# Patient Record
Sex: Male | Born: 1975 | Hispanic: No | Marital: Married | Smoking: Former smoker
Health system: Southern US, Community
[De-identification: ages and names within clinical notes are randomized; demographics above are authoritative.]

## PROBLEM LIST (undated history)

## (undated) DIAGNOSIS — T7840XA Allergy, unspecified, initial encounter: Secondary | ICD-10-CM

## (undated) DIAGNOSIS — E785 Hyperlipidemia, unspecified: Secondary | ICD-10-CM

## (undated) HISTORY — DX: Hyperlipidemia, unspecified: E78.5

## (undated) HISTORY — DX: Allergy, unspecified, initial encounter: T78.40XA

---

## 2016-02-22 DIAGNOSIS — H5213 Myopia, bilateral: Secondary | ICD-10-CM | POA: Diagnosis not present

## 2016-02-22 DIAGNOSIS — H52223 Regular astigmatism, bilateral: Secondary | ICD-10-CM | POA: Diagnosis not present

## 2017-04-27 ENCOUNTER — Ambulatory Visit: Payer: Self-pay | Admitting: Physician Assistant

## 2017-06-01 DIAGNOSIS — Z7689 Persons encountering health services in other specified circumstances: Secondary | ICD-10-CM | POA: Diagnosis not present

## 2017-06-01 DIAGNOSIS — R002 Palpitations: Secondary | ICD-10-CM | POA: Diagnosis not present

## 2017-06-07 DIAGNOSIS — R002 Palpitations: Secondary | ICD-10-CM | POA: Diagnosis not present

## 2017-06-08 DIAGNOSIS — Z0000 Encounter for general adult medical examination without abnormal findings: Secondary | ICD-10-CM | POA: Diagnosis not present

## 2017-06-08 DIAGNOSIS — R002 Palpitations: Secondary | ICD-10-CM | POA: Diagnosis not present

## 2017-06-08 DIAGNOSIS — Z125 Encounter for screening for malignant neoplasm of prostate: Secondary | ICD-10-CM | POA: Diagnosis not present

## 2017-08-21 DIAGNOSIS — R0781 Pleurodynia: Secondary | ICD-10-CM | POA: Diagnosis not present

## 2018-02-04 DIAGNOSIS — H524 Presbyopia: Secondary | ICD-10-CM | POA: Diagnosis not present

## 2018-02-04 DIAGNOSIS — H5213 Myopia, bilateral: Secondary | ICD-10-CM | POA: Diagnosis not present

## 2018-02-04 DIAGNOSIS — H52223 Regular astigmatism, bilateral: Secondary | ICD-10-CM | POA: Diagnosis not present

## 2019-02-27 ENCOUNTER — Ambulatory Visit: Payer: Self-pay | Attending: Internal Medicine

## 2019-02-27 DIAGNOSIS — Z20828 Contact with and (suspected) exposure to other viral communicable diseases: Secondary | ICD-10-CM | POA: Insufficient documentation

## 2019-02-27 DIAGNOSIS — Z20822 Contact with and (suspected) exposure to covid-19: Secondary | ICD-10-CM

## 2019-02-28 LAB — NOVEL CORONAVIRUS, NAA: SARS-CoV-2, NAA: NOT DETECTED

## 2019-03-12 DIAGNOSIS — H5213 Myopia, bilateral: Secondary | ICD-10-CM | POA: Diagnosis not present

## 2019-03-12 DIAGNOSIS — H524 Presbyopia: Secondary | ICD-10-CM | POA: Diagnosis not present

## 2019-03-12 DIAGNOSIS — H52223 Regular astigmatism, bilateral: Secondary | ICD-10-CM | POA: Diagnosis not present

## 2019-04-15 ENCOUNTER — Ambulatory Visit: Payer: 59 | Attending: Internal Medicine

## 2019-04-15 DIAGNOSIS — Z20822 Contact with and (suspected) exposure to covid-19: Secondary | ICD-10-CM | POA: Insufficient documentation

## 2019-04-15 DIAGNOSIS — Z03818 Encounter for observation for suspected exposure to other biological agents ruled out: Secondary | ICD-10-CM | POA: Diagnosis not present

## 2019-04-15 DIAGNOSIS — Z20828 Contact with and (suspected) exposure to other viral communicable diseases: Secondary | ICD-10-CM | POA: Diagnosis not present

## 2019-04-16 LAB — NOVEL CORONAVIRUS, NAA: SARS-CoV-2, NAA: NOT DETECTED

## 2020-01-21 ENCOUNTER — Other Ambulatory Visit: Payer: Self-pay | Admitting: Internal Medicine

## 2020-01-21 DIAGNOSIS — R109 Unspecified abdominal pain: Secondary | ICD-10-CM | POA: Diagnosis not present

## 2020-01-21 DIAGNOSIS — R1032 Left lower quadrant pain: Secondary | ICD-10-CM | POA: Diagnosis not present

## 2020-01-21 DIAGNOSIS — Z13220 Encounter for screening for lipoid disorders: Secondary | ICD-10-CM | POA: Diagnosis not present

## 2020-01-21 DIAGNOSIS — Z125 Encounter for screening for malignant neoplasm of prostate: Secondary | ICD-10-CM | POA: Diagnosis not present

## 2020-01-27 ENCOUNTER — Ambulatory Visit
Admission: RE | Admit: 2020-01-27 | Discharge: 2020-01-27 | Disposition: A | Discharge status: Home / Self Care | Payer: 59 | Source: Ambulatory Visit | Attending: Internal Medicine | Admitting: Internal Medicine

## 2020-01-27 ENCOUNTER — Other Ambulatory Visit: Payer: Self-pay

## 2020-01-27 DIAGNOSIS — R1032 Left lower quadrant pain: Secondary | ICD-10-CM | POA: Diagnosis not present

## 2020-01-27 DIAGNOSIS — K760 Fatty (change of) liver, not elsewhere classified: Secondary | ICD-10-CM | POA: Diagnosis not present

## 2020-01-27 MED ORDER — IOHEXOL 300 MG/ML  SOLN
100.0000 mL | Freq: Once | INTRAMUSCULAR | Status: AC | PRN
  Administered 2020-01-27: 100 mL via INTRAVENOUS

## 2020-03-22 DIAGNOSIS — Z03818 Encounter for observation for suspected exposure to other biological agents ruled out: Secondary | ICD-10-CM | POA: Diagnosis not present

## 2020-03-22 DIAGNOSIS — Z20822 Contact with and (suspected) exposure to covid-19: Secondary | ICD-10-CM | POA: Diagnosis not present

## 2020-03-25 DIAGNOSIS — Z03818 Encounter for observation for suspected exposure to other biological agents ruled out: Secondary | ICD-10-CM | POA: Diagnosis not present

## 2020-03-25 DIAGNOSIS — Z20822 Contact with and (suspected) exposure to covid-19: Secondary | ICD-10-CM | POA: Diagnosis not present

## 2021-11-02 IMAGING — CT CT ABD-PELV W/ CM
2 of 5 series · 13 of 32 positions shown, 19 images · IV contrast (APPLIED)
Comparison: None.

CLINICAL DATA: Left lower quadrant pain

EXAM:
CT ABDOMEN AND PELVIS WITH CONTRAST
TECHNIQUE: Multidetector CT imaging of the abdomen and pelvis was performed
using the standard protocol following bolus administration of
intravenous contrast.
CONTRAST:  100mL OMNIPAQUE IOHEXOL 300 MG/ML  SOLN

[Series 2: axial st · axial · 0.80mm/px · z∈[-976,-550]mm · 11 of 103 slices shown, 17 images]
[im 9/103  soft-tissue]
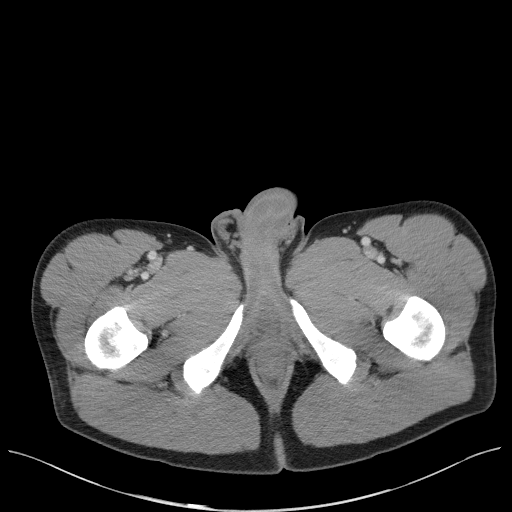
[im 9/103  bone]
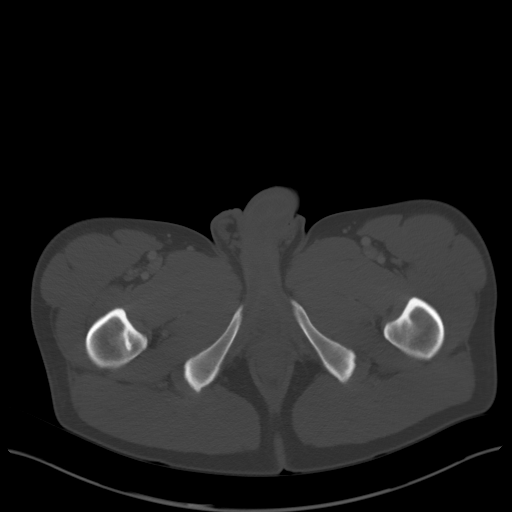
[im 18/103  soft-tissue]
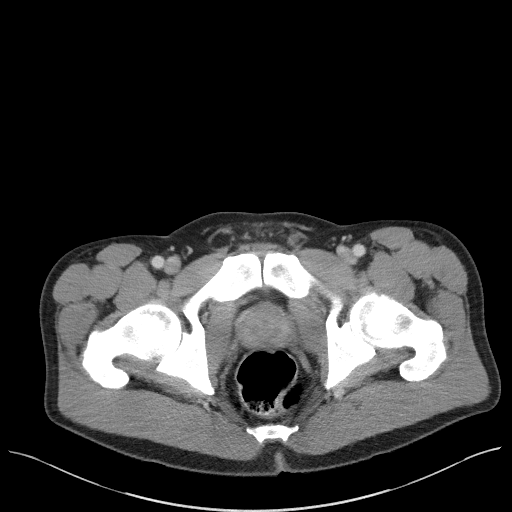
[im 26/103  soft-tissue]
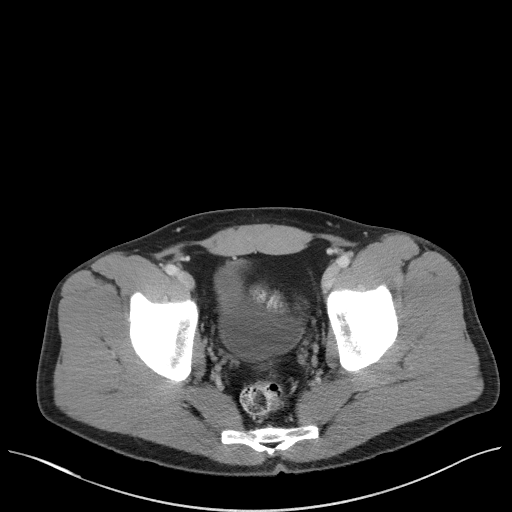
[im 35/103  soft-tissue]
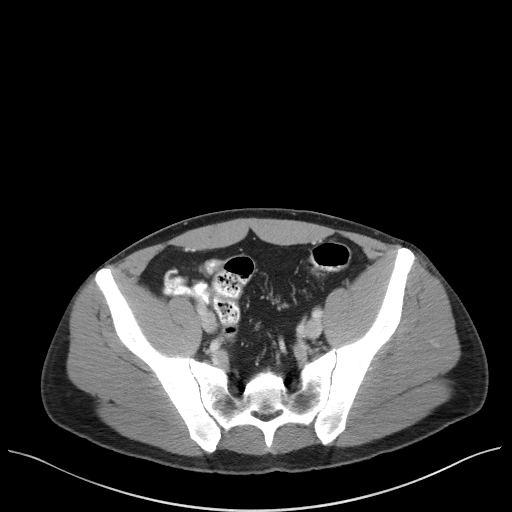
[im 43/103  soft-tissue]
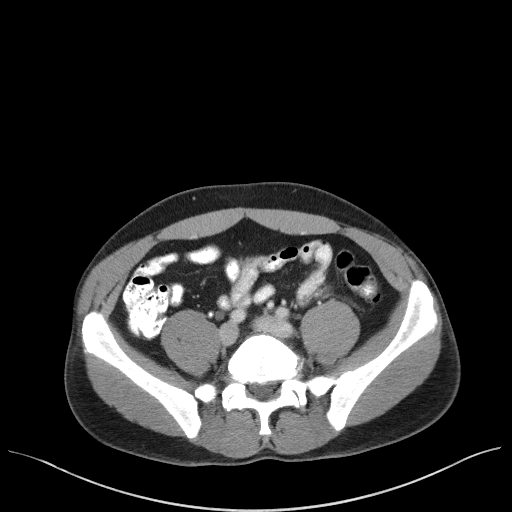
[im 52/103  soft-tissue]
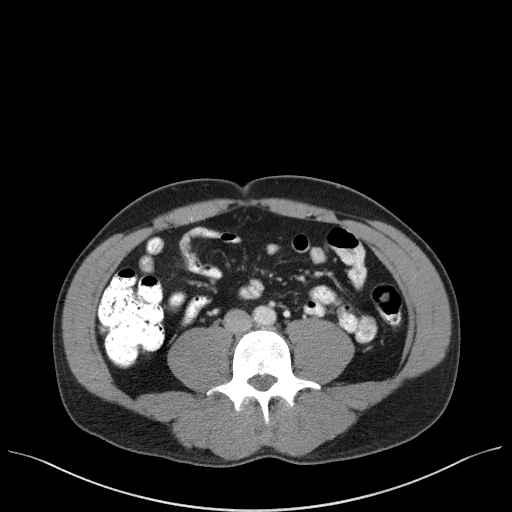
[im 60/103  soft-tissue]
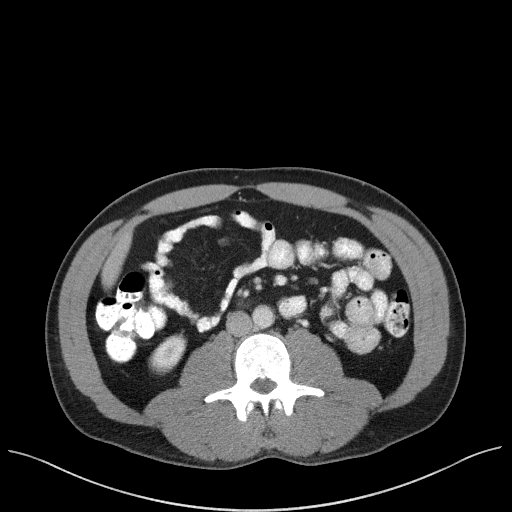
[im 69/103  soft-tissue]
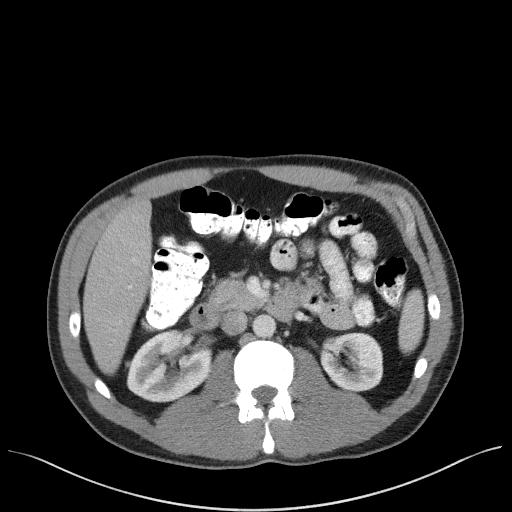
[im 69/103  lung]
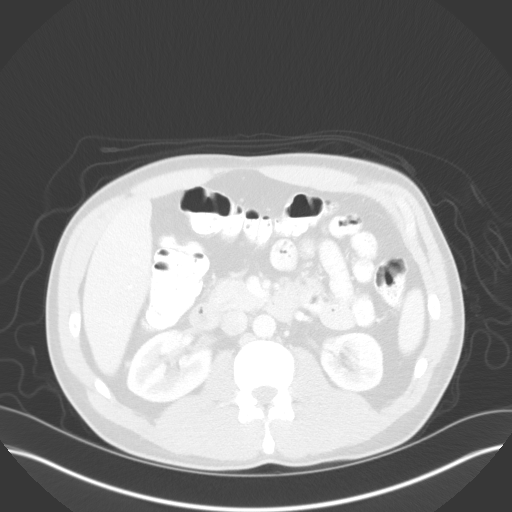
[im 77/103  soft-tissue]
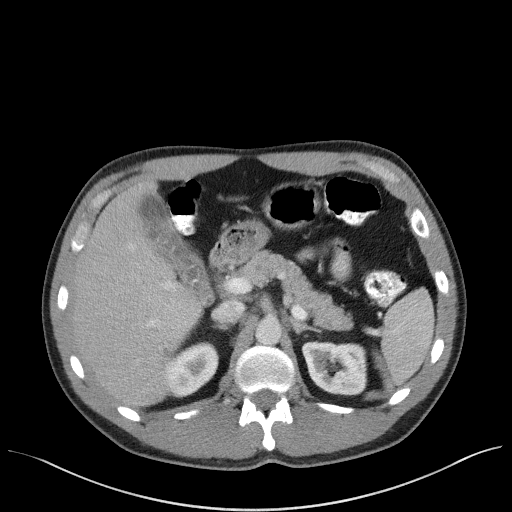
[im 77/103  lung]
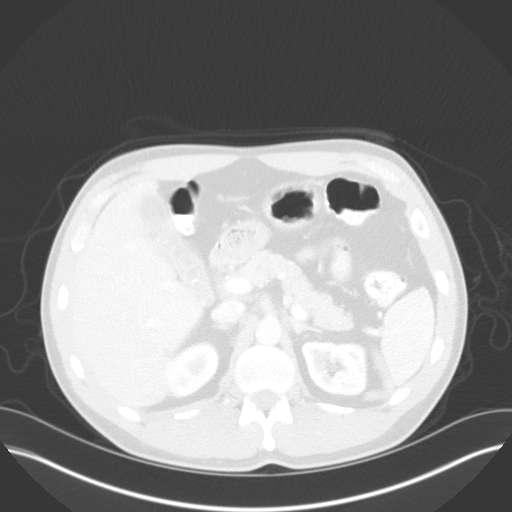
[im 77/103  bone]
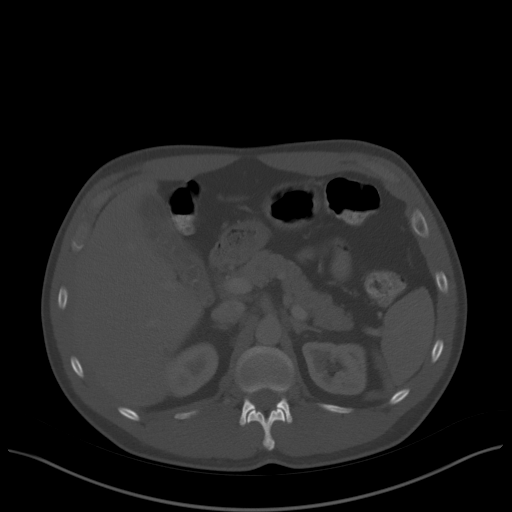
[im 86/103  soft-tissue]
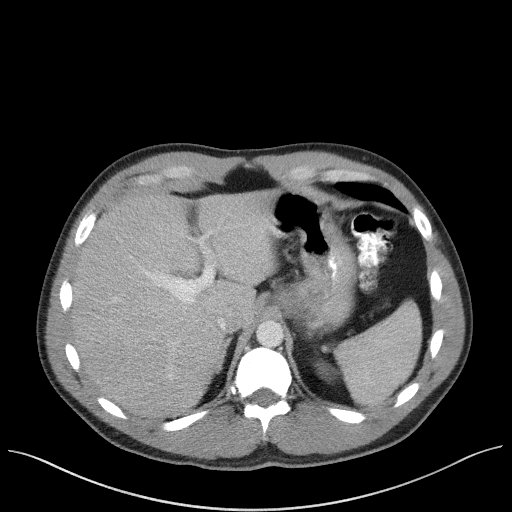
[im 86/103  lung]
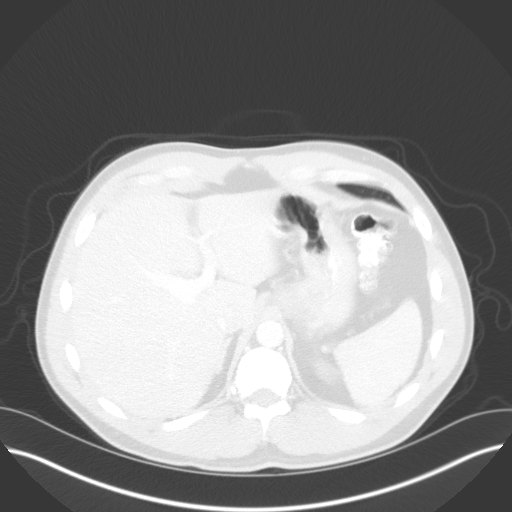
[im 94/103  soft-tissue]
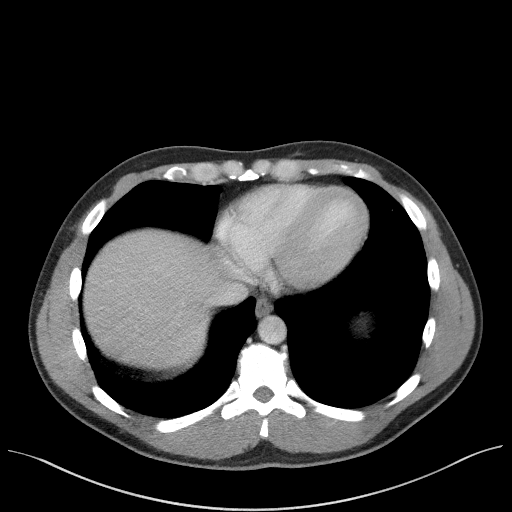
[im 94/103  lung]
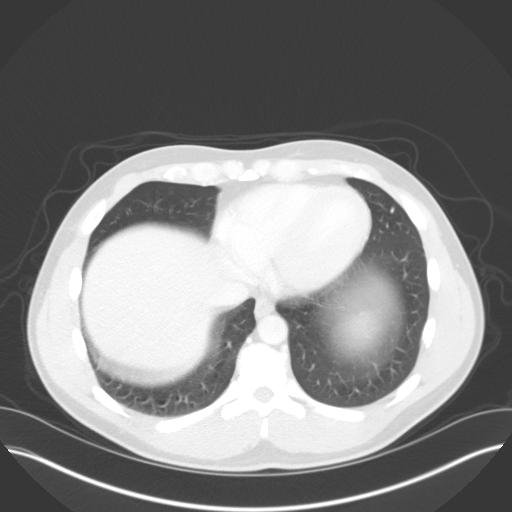

[Series 5: axial st repeat. · axial · 0.80mm/px · z∈[-650,-600]mm · 2 of 39 slices shown]
[im 10/39  soft-tissue]
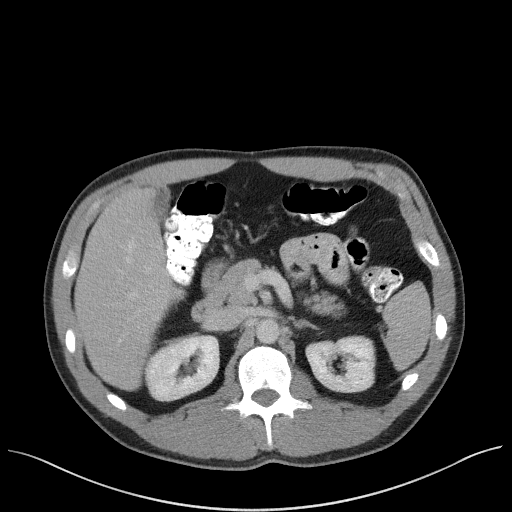
[im 20/39  soft-tissue]
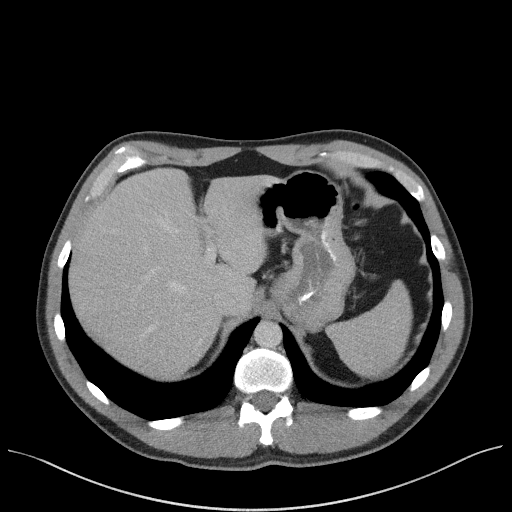

[13 of 32 positions shown; findings below may reference images not displayed]

FINDINGS: Lower chest: Lung bases are clear. No effusions. Heart is normal
size.

Hepatobiliary: Gallstones fill the gallbladder. No focal hepatic
abnormality. Suspect mild diffuse fatty infiltration of the liver.

Pancreas: No focal abnormality or ductal dilatation.

Spleen: No focal abnormality.  Normal size.

Adrenals/Urinary Tract: No adrenal abnormality. No focal renal
abnormality. No stones or hydronephrosis. Urinary bladder is
unremarkable.

Stomach/Bowel: Normal appendix. Stomach, large and small bowel
grossly unremarkable.

Vascular/Lymphatic: No evidence of aneurysm or adenopathy.

Reproductive: No visible focal abnormality.

Other: No free fluid or free air.

Musculoskeletal: No acute bony abnormality.
IMPRESSION: Cholelithiasis.

Suspect mild hepatic steatosis.

No acute findings in the abdomen or pelvis. No explanation for the
patient's left lower quadrant pain.

## 2022-03-28 DIAGNOSIS — R5383 Other fatigue: Secondary | ICD-10-CM | POA: Diagnosis not present

## 2022-03-28 DIAGNOSIS — Z1211 Encounter for screening for malignant neoplasm of colon: Secondary | ICD-10-CM | POA: Diagnosis not present

## 2022-03-28 DIAGNOSIS — Z2821 Immunization not carried out because of patient refusal: Secondary | ICD-10-CM | POA: Diagnosis not present

## 2022-03-28 DIAGNOSIS — Z1331 Encounter for screening for depression: Secondary | ICD-10-CM | POA: Diagnosis not present

## 2022-03-28 DIAGNOSIS — R5381 Other malaise: Secondary | ICD-10-CM | POA: Diagnosis not present

## 2022-03-28 DIAGNOSIS — Z0000 Encounter for general adult medical examination without abnormal findings: Secondary | ICD-10-CM | POA: Diagnosis not present

## 2022-05-10 ENCOUNTER — Ambulatory Visit (AMBULATORY_SURGERY_CENTER): Payer: Commercial Managed Care - PPO | Admitting: *Deleted

## 2022-05-10 ENCOUNTER — Encounter: Payer: Self-pay | Admitting: Internal Medicine

## 2022-05-10 VITALS — Ht 70.0 in | Wt 190.0 lb

## 2022-05-10 DIAGNOSIS — Z1211 Encounter for screening for malignant neoplasm of colon: Secondary | ICD-10-CM

## 2022-05-10 MED ORDER — NA SULFATE-K SULFATE-MG SULF 17.5-3.13-1.6 GM/177ML PO SOLN: 1.0000 | Freq: Once | ORAL | 0 refills | Status: AC

## 2022-05-10 NOTE — Progress Notes (Signed)
No egg or soy allergy known to patient  No issues known to pt with past sedation with any surgeries or procedures Patient denies ever being intubated No issues moving head or neck No issues with swallowing  No FH of Malignant Hyperthermia Pt is not on diet pills Pt is not on  home 02  Pt is not on blood thinners  Pt denies issues with constipation  Pt is not on dialysis Pt denies any upcoming cardiac testing Pt encouraged to use to use Singlecare or Goodrx to reduce cost  Patient's chart reviewed by Osvaldo Angst CNRA prior to previsit and patient appropriate for the [place].  Previsit completed and red dot placed by patient's name on their procedure day (on provider's schedule).  .  Instructions reviewed with pt and pt states understanding. Instructed to review again prior to procedure. Pt states they will.

## 2022-05-29 ENCOUNTER — Other Ambulatory Visit: Payer: Self-pay

## 2022-05-29 MED ORDER — NA SULFATE-K SULFATE-MG SULF 17.5-3.13-1.6 GM/177ML PO SOLN
354.0000 mL | Freq: Once | ORAL | 0 refills | Status: AC
  Filled 2022-05-29: qty 354, 1d supply, fill #0

## 2022-05-29 MED ORDER — NA SULFATE-K SULFATE-MG SULF 17.5-3.13-1.6 GM/177ML PO SOLN
354.0000 mL | Freq: Once | ORAL | 0 refills | Status: DC
  Filled 2022-05-29: qty 354, 1d supply, fill #0

## 2022-05-29 MED ORDER — ATORVASTATIN CALCIUM 10 MG PO TABS
10.0000 mg | ORAL_TABLET | Freq: Every day | ORAL | 1 refills | Status: AC
  Filled 2022-05-29: qty 90, 90d supply, fill #0

## 2022-05-29 MED ORDER — ATORVASTATIN CALCIUM 10 MG PO TABS
10.0000 mg | ORAL_TABLET | Freq: Every day | ORAL | 1 refills | Status: DC
  Filled 2022-05-29: qty 90, 90d supply, fill #0

## 2022-06-01 ENCOUNTER — Other Ambulatory Visit: Payer: Self-pay

## 2022-06-08 ENCOUNTER — Ambulatory Visit (AMBULATORY_SURGERY_CENTER): Payer: Commercial Managed Care - PPO | Admitting: Internal Medicine

## 2022-06-08 ENCOUNTER — Encounter: Payer: Self-pay | Admitting: Internal Medicine

## 2022-06-08 VITALS — BP 99/47 | HR 80 | Temp 96.8°F | Resp 12 | Ht 70.0 in | Wt 190.0 lb

## 2022-06-08 DIAGNOSIS — D123 Benign neoplasm of transverse colon: Secondary | ICD-10-CM

## 2022-06-08 DIAGNOSIS — Z1211 Encounter for screening for malignant neoplasm of colon: Secondary | ICD-10-CM

## 2022-06-08 DIAGNOSIS — E785 Hyperlipidemia, unspecified: Secondary | ICD-10-CM | POA: Diagnosis not present

## 2022-06-08 MED ORDER — SODIUM CHLORIDE 0.9 % IV SOLN: 500.0000 mL | INTRAVENOUS | Status: DC

## 2022-06-08 NOTE — Progress Notes (Signed)
Called to room to assist during endoscopic procedure.  Patient ID and intended procedure confirmed with present staff. Received instructions for my participation in the procedure from the performing physician.  

## 2022-06-08 NOTE — Progress Notes (Signed)
Pt resting comfortably. VSS. Airway intact. SBAR complete to RN. All questions answered.   

## 2022-06-08 NOTE — Progress Notes (Signed)
Pt. states no medical or surgical changes since previsit or office visit. 

## 2022-06-08 NOTE — Patient Instructions (Addendum)
Recommendation:- Repeat colonoscopy in 7-10 years for surveillance.                           - Patient has a contact number available for                            emergencies. The signs and symptoms of potential                            delayed complications were discussed with the                            patient. Return to normal activities tomorrow.                            Written discharge instructions were provided to the                            patient.                           - Resume previous diet.                           - Continue present medications.                           - Await pathology results.  Handout on polyps given.  YOU HAD AN ENDOSCOPIC PROCEDURE TODAY AT THE [facility]:   Refer to the procedure report that was given to you for any specific questions about what was found during the examination.  If the procedure report does not answer your questions, please call your gastroenterologist to clarify.  If you requested that your care partner not be given the details of your procedure findings, then the procedure report has been included in a sealed envelope for you to review at your convenience later.  YOU SHOULD EXPECT: Some feelings of bloating in the abdomen. Passage of more gas than usual.  Walking can help get rid of the air that was put into your GI tract during the procedure and reduce the bloating. If you had a lower endoscopy (such as a colonoscopy or flexible sigmoidoscopy) you may notice spotting of blood in your stool or on the toilet paper. If you underwent a bowel prep for your procedure, you may not have a normal bowel movement for a few days.  Please Note:  You might notice some irritation and congestion in your nose or some drainage.  This is from the oxygen used during your procedure.  There is no need for concern and it should clear up in a day or so.  SYMPTOMS TO REPORT IMMEDIATELY:  Following lower endoscopy (colonoscopy or  flexible sigmoidoscopy):  Excessive amounts of blood in the stool  Significant tenderness or worsening of abdominal pains  Swelling of the abdomen that is new, acute  Fever of 100F or higher  For urgent or emergent issues, a gastroenterologist can be reached at any hour by calling [phone]. Do not use MyChart messaging for urgent concerns.   DIET:  We do recommend a small meal at   first, but then you may proceed to your regular diet.  Drink plenty of fluids but you should avoid alcoholic beverages for 24 hours.  ACTIVITY:  You should plan to take it easy for the rest of today and you should NOT DRIVE or use heavy machinery until tomorrow (because of the sedation medicines used during the test).    FOLLOW UP: Our staff will call the number listed on your records the next business day following your procedure.  We will call around 7:15- 8:00 am to check on you and address any questions or concerns that you may have regarding the information given to you following your procedure. If we do not reach you, we will leave a message.     If any biopsies were taken you will be contacted by phone or by letter within the next 1-3 weeks.  Please call us at [phone] if you have not heard about the biopsies in 3 weeks.   SIGNATURES/CONFIDENTIALITY: You and/or your care partner have signed paperwork which will be entered into your electronic medical record.  These signatures attest to the fact that that the information above on your After Visit Summary has been reviewed and is understood.  Full responsibility of the confidentiality of this discharge information lies with you and/or your care-partner. 

## 2022-06-08 NOTE — Progress Notes (Signed)
HISTORY OF PRESENT ILLNESS:  Anthony Blevins is a 47 y.o. male who is sent for routine screening colonoscopy.  Average risk.  Index exam.  No complaints  REVIEW OF SYSTEMS:  All non-GI ROS negative.  Past Medical History:  Diagnosis Date   Allergy    Hyperlipidemia     History reviewed. No pertinent surgical history.  Social History Kvon Trundy  reports that he has quit smoking. His smoking use included cigarettes. He has never used smokeless tobacco. He reports current alcohol use. He reports that he does not use drugs.  family history includes Stomach cancer in his paternal uncle.  Allergies  Allergen Reactions   Penicillin G Other (See Comments)       PHYSICAL EXAMINATION: Vital signs: BP (!) 99/47   Pulse 80   Temp (!) 96.8 F (36 C) (Skin)   Resp 12   Ht 5\' 10"  (1.778 m)   Wt 190 lb (86.2 kg)   SpO2 95%   BMI 27.26 kg/m  General: Well-developed, well-nourished, no acute distress HEENT: Sclerae are anicteric, conjunctiva pink. Oral mucosa intact Lungs: Clear Heart: Regular Abdomen: soft, nontender, nondistended, no obvious ascites, no peritoneal signs, normal bowel sounds. No organomegaly. Extremities: No edema Psychiatric: alert and oriented x3. Cooperative     ASSESSMENT:  Colon cancer screening   PLAN:   Screening colonoscopy

## 2022-06-08 NOTE — Op Note (Signed)
[facility] Patient Name: Anthony Blevins Procedure Date: 06/08/2022 7:53 AM MRN: [id number] Endoscopist: Wilhemina Bonito. Marina Goodell , MD, [id number] Age: 47 Referring MD:  Date of Birth: 25-Oct-1975 Gender: Male Account #: [id number] Procedure:                Colonoscopy with cold snare polypectomy x 1 Indications:              Screening for colorectal malignant neoplasm Medicines:                Monitored Anesthesia Care Procedure:                Pre-Anesthesia Assessment:                           - Prior to the procedure, a History and Physical                            was performed, and patient medications and                            allergies were reviewed. The patient's tolerance of                            previous anesthesia was also reviewed. The risks                            and benefits of the procedure and the sedation                            options and risks were discussed with the patient.                            All questions were answered, and informed consent                            was obtained. Prior Anticoagulants: The patient has                            taken no anticoagulant or antiplatelet agents. ASA                            Grade Assessment: II - A patient with mild systemic                            disease. After reviewing the risks and benefits,                            the patient was deemed in satisfactory condition to                            undergo the procedure.                           After obtaining informed consent, the colonoscope  was passed under direct vision. Throughout the                            procedure, the patient's blood pressure, pulse, and                            oxygen saturations were monitored continuously. The                            CF [id number] #[id number] was introduced through the anus                            and advanced to the the cecum, identified by                             appendiceal orifice and ileocecal valve. The                            ileocecal valve, appendiceal orifice, and rectum                            were photographed. The quality of the bowel                            preparation was excellent. The colonoscopy was                            performed without difficulty. The patient tolerated                            the procedure well. The bowel preparation used was                            SUPREP via split dose instruction. Scope In: 8:32:47 AM Scope Out: 8:47:48 AM Scope Withdrawal Time: 0 hours 12 minutes 40 seconds  Total Procedure Duration: 0 hours 15 minutes 1 second  Findings:                 A 3 mm polyp was found in the transverse colon. The                            polyp was removed with a cold snare. Resection and                            retrieval were complete.                           The exam was otherwise without abnormality on                            direct and retroflexion views. Complications:            No immediate complications. Estimated blood loss:  None. Estimated Blood Loss:     Estimated blood loss: none. Impression:               - One 3 mm polyp in the transverse colon, removed                            with a cold snare. Resected and retrieved.                           - The examination was otherwise normal on direct                            and retroflexion views. Recommendation:           - Repeat colonoscopy in 7-10 years for surveillance.                           - Patient has a contact number available for                            emergencies. The signs and symptoms of potential                            delayed complications were discussed with the                            patient. Return to normal activities tomorrow.                            Written discharge instructions were provided to the                            patient.                            - Resume previous diet.                           - Continue present medications.                           - Await pathology results. Wilhemina Bonito. Marina Goodell, MD 06/08/2022 8:57:09 AM This report has been signed electronically.

## 2022-06-09 ENCOUNTER — Telehealth: Payer: Self-pay | Admitting: *Deleted

## 2022-06-09 NOTE — Telephone Encounter (Signed)
  Follow up Call-     06/08/2022    7:21 AM  Call back number  Post procedure Call Back phone  # [phone]  Permission to leave phone message Yes     Patient questions:  Do you have a fever, pain , or abdominal swelling? No. Pain Score  0 *  Have you tolerated food without any problems? Yes.    Have you been able to return to your normal activities? Yes.    Do you have any questions about your discharge instructions: Diet   No. Medications  No. Follow up visit  No.  Do you have questions or concerns about your Care? No.  Actions: * If pain score is 4 or above: No action needed, pain <4.

## 2022-06-15 ENCOUNTER — Encounter: Payer: Self-pay | Admitting: Internal Medicine

## 2022-10-04 ENCOUNTER — Other Ambulatory Visit: Payer: Self-pay | Admitting: Physician Assistant

## 2022-10-04 ENCOUNTER — Ambulatory Visit
Admission: RE | Admit: 2022-10-04 | Discharge: 2022-10-04 | Disposition: A | Discharge status: Home / Self Care | Payer: Commercial Managed Care - PPO | Source: Ambulatory Visit | Attending: Physician Assistant | Admitting: Physician Assistant

## 2022-10-04 ENCOUNTER — Other Ambulatory Visit: Payer: Self-pay

## 2022-10-04 DIAGNOSIS — K828 Other specified diseases of gallbladder: Secondary | ICD-10-CM | POA: Diagnosis not present

## 2022-10-04 DIAGNOSIS — R1011 Right upper quadrant pain: Secondary | ICD-10-CM

## 2022-10-04 DIAGNOSIS — R197 Diarrhea, unspecified: Secondary | ICD-10-CM | POA: Diagnosis not present

## 2022-10-04 DIAGNOSIS — K8020 Calculus of gallbladder without cholecystitis without obstruction: Secondary | ICD-10-CM | POA: Diagnosis not present

## 2022-10-04 MED ORDER — ONDANSETRON 4 MG PO TBDP
4.0000 mg | ORAL_TABLET | Freq: Three times a day (TID) | ORAL | 0 refills | Status: AC | PRN
  Filled 2022-10-04: qty 20, 7d supply, fill #0

## 2022-10-05 ENCOUNTER — Other Ambulatory Visit: Payer: Commercial Managed Care - PPO

## 2022-10-09 ENCOUNTER — Ambulatory Visit: Payer: Commercial Managed Care - PPO | Admitting: Surgery

## 2022-10-09 ENCOUNTER — Encounter: Payer: Self-pay | Admitting: Surgery

## 2022-10-09 VITALS — BP 130/74 | HR 61 | Temp 98.2°F | Ht 70.5 in | Wt 179.0 lb

## 2022-10-09 DIAGNOSIS — K8020 Calculus of gallbladder without cholecystitis without obstruction: Secondary | ICD-10-CM

## 2022-10-09 NOTE — Progress Notes (Signed)
10/09/2022  Reason for Visit:  Cholelithiasis  Requesting Provider:  Maurine Minister, PA-C  History of Present Illness: Anthony Blevins is a 47 y.o. male presenting for evaluation of cholelithiasis.  The patient has a known history of cholelithiasis as seen previously on a CT scan of abdomen/pelvis on 01/27/20 which was done for left lower quadrant pain.  He reports that last weekend on 7/28, he started having pain in the periumbilical area, which he describes as a burning sensation.  This was associated with nausea but no emesis, and with profuse diarrhea.  He denies any RUQ or epigastric pain, or any radiation to his back.  He does report having gone out the night before and drinking a lot.  On 10/04/22 he went to his PCP's office and had labs and U/S done.  His U/S of RUQ showed cholelithiasis, with gall thickness of 3 mm without any pericholecystic fluid and with negative Murphy's sign.  His labs showed a WBC of 10.6, total bili of 1.8, AST 34, ALT 40, and Cr 1.5.  he does report feeling dehydrated with dark urine.  This has since normalized.  Currently he denies any pain, nausea, or emesis.  Past Medical History: Past Medical History:  Diagnosis Date   Allergy    Hyperlipidemia      Past Surgical History: History reviewed. No pertinent surgical history.  Home Medications: Prior to Admission medications   Medication Sig Start Date End Date Taking? Authorizing Provider  atorvastatin (LIPITOR) 10 MG tablet Take 1 tablet (10 mg total) by mouth daily. 04/01/22  Yes   Multiple Vitamin (MULTIVITAMIN WITH MINERALS) TABS tablet Take 1 tablet by mouth daily.   Yes [provider]  ondansetron (ZOFRAN-ODT) 4 MG disintegrating tablet Take 1 tablet (4 mg total) by mouth every 8 (eight) hours as needed. 10/04/22  Yes     Allergies: Allergies  Allergen Reactions   Penicillin G Other (See Comments)    Social History:  reports that he has quit smoking. His smoking use included  cigarettes. He has never used smokeless tobacco. He reports current alcohol use. He reports that he does not use drugs.   Family History: Family History  Problem Relation Age of Onset   Stomach cancer Paternal Uncle    Colon cancer Neg Hx    Colon polyps Neg Hx    Esophageal cancer Neg Hx    Rectal cancer Neg Hx     Review of Systems: Review of Systems  Constitutional:  Negative for chills and fever.  Respiratory:  Negative for shortness of breath.   Cardiovascular:  Negative for chest pain.  Gastrointestinal:  Positive for abdominal pain, diarrhea and nausea. Negative for vomiting.  Genitourinary:  Negative for dysuria.    Physical Exam BP 130/74   Pulse 61   Temp 98.2 F (36.8 C) (Oral)   Ht 5' 10.5" (1.791 m)   Wt 179 lb (81.2 kg)   SpO2 98%   BMI 25.32 kg/m  CONSTITUTIONAL: No acute distress, well nourished. HEENT:  Normocephalic, atraumatic, extraocular motion intact.  RESPIRATORY:  Normal respiratory effort without pathologic use of accessory muscles. CARDIOVASCULAR: Regular rhythm and rate GI: The abdomen is soft, non-distended, non-tender to palpation.  No umbilical hernia.  Negative Murphy's sign.  MUSCULOSKELETAL:  Normal muscle strength and tone in all four extremities.  No peripheral edema or cyanosis. NEUROLOGIC:  Motor and sensation is grossly normal.  Cranial nerves are grossly intact. PSYCH:  Alert and oriented to person, place and time. Affect is  normal.  Laboratory Analysis: Labs from 10/04/22: Na 138, K 4.2, Cl 102, CO2 26.6, BUN 17, Cr 1.5.  Total bili 1.8, direct bili 0.28, AST 34, ALT 40, Alk Phos 71, albumin 5.0.  WBC 10.6, Hgb 17.1, Hct 48.3, Plt 259.  Imaging: Ultrasound RUQ on 10/04/22: IMPRESSION: 1. Gallstones. 2. Gallbladder wall thickening is upper limits of normal at 3 mm. Negative for pericholecystic fluid or sonographic Murphy's sign. If there is a high clinical concern for acute cholecystitis consider further evaluation with nuclear  medicine hepatic biliary scan.  CT abdomen/pelvis on 01/27/20: IMPRESSION: Cholelithiasis. Suspect mild hepatic steatosis. No acute findings in the abdomen or pelvis. No explanation for the patient's left lower quadrant pain  Assessment and Plan: This is a 47 y.o. male with cholelithiasis.  --Discussed with the patient that although his CT scan from 2021 and ultrasound last week show cholelithiasis, his symptoms last week are more atypical given that his pain was more periumbilical with burning sensation, profuse diarrhea.  Discussed with him the more typical symptoms from biliary colic including RUQ or epigastric pain, sometimes radiation to the back, sometimes associated with nausea/vomiting.  Discussed with him how these symptoms start with his gallstones transiently obstructing the cystic duct.  Discussed how the gallbladder is more triggered by fatty/greasy foods.  The symptoms that he had could be more related to gastroenteritis vs alcohol related.  It is unclear which is the exact etiology, and although there is the possibility of being biliary related, cannot guarantee him that after cholecystectomy, these symptoms wont occur again.   --As such, for now would not recommend proceeding with cholecystectomy.  I think for now we can start watchful monitoring of symptoms, dietary modifications, and he will follow up with me in about a month to reassess. --He and his wife have a trip planned out of country in two weeks.  I think for now it's ok to proceed with that trip since it's unclear that this was gallbladder in etiology.    I spent 30 minutes dedicated to the care of this patient on the date of this encounter to include pre-visit review of records, face-to-face time with the patient discussing diagnosis and management, and any post-visit coordination of care.   Howie Ill, MD [facility]

## 2022-10-09 NOTE — Patient Instructions (Signed)
If you have any concerns or questions, please feel free to call our office. See follow up appointment.   Gallbladder Eating Plan High blood cholesterol, obesity, a sedentary lifestyle, an unhealthy diet, and diabetes are risk factors for developing gallstones. If you have a gallbladder condition, you may have trouble digesting fats and tolerating high fat intake. Eating a low-fat diet can help reduce your symptoms and may be helpful before and after having surgery to remove your gallbladder (cholecystectomy). Your health care provider may recommend that you work with a dietitian to help you reduce the amount of fat in your diet. What are tips for following this plan? General guidelines Limit your fat intake to less than 30% of your total daily calories. If you eat around 1,800 calories each day, this means eating less than 60 grams (g) of fat per day. Fat is an important part of a healthy diet. Eating a low-fat diet can make it hard to maintain a healthy body weight. Ask your dietitian how much fat, calories, and other nutrients you need each day. Eat small, frequent meals throughout the day instead of three large meals. Drink at least 8-10 cups (1.9-2.4 L) of fluid a day. Drink enough fluid to keep your urine pale yellow. If you drink alcohol: Limit how much you have to: 0-1 drink a day for women who are not pregnant. 0-2 drinks a day for men. Know how much alcohol is in a drink. In the [place] one drink equals one 12 oz bottle of beer (355 mL), one 5 oz glass of wine (148 mL), or one 1 oz glass of hard liquor (44 mL). Reading food labels  Check nutrition facts on food labels for the amount of fat per serving. Choose foods with less than 3 grams of fat per serving. Shopping Choose nonfat and low-fat healthy foods. Look for the words "nonfat," "low-fat," or "fat-free." Avoid buying processed or prepackaged foods. Cooking Cook using low-fat methods, such as baking, broiling, grilling, or  boiling. Cook with small amounts of healthy fats, such as olive oil, grapeseed oil, canola oil, avocado oil, or sunflower oil. What foods are recommended?  All fresh, frozen, or canned fruits and vegetables. Whole grains. Low-fat or nonfat (skim) milk and yogurt. Lean meat, skinless poultry, fish, eggs, and beans. Low-fat protein supplement powders or drinks. Spices and herbs. The items listed above may not be a complete list of foods and beverages you can eat and drink. Contact a dietitian for more information. What foods are not recommended? High-fat foods. These include baked goods, fast food, fatty cuts of meat, ice cream, french toast, sweet rolls, pizza, cheese bread, foods covered with butter, creamy sauces, or cheese. Fried foods. These include french fries, tempura, battered fish, breaded chicken, fried breads, and sweets. Foods that cause bloating and gas. The items listed above may not be a complete list of foods that you should avoid. Contact a dietitian for more information. Summary A low-fat diet can be helpful if you have a gallbladder condition, or before and after gallbladder surgery. Limit your fat intake to less than 30% of your total daily calories. This is about 60 g of fat if you eat 1,800 calories each day. Eat small, frequent meals throughout the day instead of three large meals. This information is not intended to replace advice given to you by your health care provider. Make sure you discuss any questions you have with your health care provider. Document Revised: 02/04/2021 Document Reviewed: 02/04/2021 Elsevier Patient Education  2024 [employer]

## 2022-10-12 DIAGNOSIS — H524 Presbyopia: Secondary | ICD-10-CM | POA: Diagnosis not present

## 2022-11-13 ENCOUNTER — Ambulatory Visit: Payer: Commercial Managed Care - PPO | Admitting: Surgery

## 2022-12-21 DIAGNOSIS — R079 Chest pain, unspecified: Secondary | ICD-10-CM | POA: Diagnosis not present

## 2022-12-21 DIAGNOSIS — Z880 Allergy status to penicillin: Secondary | ICD-10-CM | POA: Diagnosis not present

## 2022-12-21 DIAGNOSIS — I010 Acute rheumatic pericarditis: Secondary | ICD-10-CM | POA: Diagnosis not present

## 2022-12-21 DIAGNOSIS — I2699 Other pulmonary embolism without acute cor pulmonale: Secondary | ICD-10-CM | POA: Diagnosis not present

## 2022-12-21 DIAGNOSIS — R0789 Other chest pain: Secondary | ICD-10-CM | POA: Diagnosis not present

## 2022-12-22 DIAGNOSIS — R079 Chest pain, unspecified: Secondary | ICD-10-CM | POA: Diagnosis not present

## 2022-12-28 DIAGNOSIS — R0789 Other chest pain: Secondary | ICD-10-CM | POA: Diagnosis not present

## 2022-12-28 DIAGNOSIS — R7989 Other specified abnormal findings of blood chemistry: Secondary | ICD-10-CM | POA: Diagnosis not present

## 2022-12-28 DIAGNOSIS — Z125 Encounter for screening for malignant neoplasm of prostate: Secondary | ICD-10-CM | POA: Diagnosis not present

## 2022-12-28 DIAGNOSIS — Z2821 Immunization not carried out because of patient refusal: Secondary | ICD-10-CM | POA: Diagnosis not present

## 2022-12-28 DIAGNOSIS — N289 Disorder of kidney and ureter, unspecified: Secondary | ICD-10-CM | POA: Diagnosis not present

## 2023-01-01 ENCOUNTER — Other Ambulatory Visit: Payer: Self-pay | Admitting: Internal Medicine

## 2023-01-01 DIAGNOSIS — R0789 Other chest pain: Secondary | ICD-10-CM

## 2023-01-01 DIAGNOSIS — N289 Disorder of kidney and ureter, unspecified: Secondary | ICD-10-CM

## 2023-01-12 ENCOUNTER — Ambulatory Visit
Admission: RE | Admit: 2023-01-12 | Discharge: 2023-01-12 | Disposition: A | Discharge status: Home / Self Care | Payer: Commercial Managed Care - PPO | Source: Ambulatory Visit | Attending: Internal Medicine | Admitting: Internal Medicine

## 2023-01-12 DIAGNOSIS — R0789 Other chest pain: Secondary | ICD-10-CM | POA: Insufficient documentation

## 2023-01-12 DIAGNOSIS — N289 Disorder of kidney and ureter, unspecified: Secondary | ICD-10-CM | POA: Insufficient documentation

## 2023-06-29 DIAGNOSIS — Z0000 Encounter for general adult medical examination without abnormal findings: Secondary | ICD-10-CM | POA: Diagnosis not present

## 2023-06-29 DIAGNOSIS — M79671 Pain in right foot: Secondary | ICD-10-CM | POA: Diagnosis not present

## 2023-06-29 DIAGNOSIS — R0789 Other chest pain: Secondary | ICD-10-CM | POA: Diagnosis not present

## 2023-06-29 DIAGNOSIS — E782 Mixed hyperlipidemia: Secondary | ICD-10-CM | POA: Diagnosis not present

## 2023-06-29 DIAGNOSIS — Z1331 Encounter for screening for depression: Secondary | ICD-10-CM | POA: Diagnosis not present

## 2023-06-29 DIAGNOSIS — R768 Other specified abnormal immunological findings in serum: Secondary | ICD-10-CM | POA: Diagnosis not present

## 2023-06-29 DIAGNOSIS — M79672 Pain in left foot: Secondary | ICD-10-CM | POA: Diagnosis not present

## 2023-07-16 DIAGNOSIS — M2550 Pain in unspecified joint: Secondary | ICD-10-CM | POA: Diagnosis not present

## 2023-07-16 DIAGNOSIS — R61 Generalized hyperhidrosis: Secondary | ICD-10-CM | POA: Diagnosis not present

## 2023-07-16 DIAGNOSIS — R768 Other specified abnormal immunological findings in serum: Secondary | ICD-10-CM | POA: Diagnosis not present

## 2023-07-16 DIAGNOSIS — M3500 Sicca syndrome, unspecified: Secondary | ICD-10-CM | POA: Diagnosis not present

## 2023-10-24 ENCOUNTER — Other Ambulatory Visit: Payer: Self-pay

## 2023-10-24 DIAGNOSIS — E7800 Pure hypercholesterolemia, unspecified: Secondary | ICD-10-CM | POA: Diagnosis not present

## 2023-10-24 DIAGNOSIS — E538 Deficiency of other specified B group vitamins: Secondary | ICD-10-CM | POA: Diagnosis not present

## 2023-10-24 DIAGNOSIS — K8020 Calculus of gallbladder without cholecystitis without obstruction: Secondary | ICD-10-CM | POA: Diagnosis not present

## 2023-10-24 DIAGNOSIS — R7989 Other specified abnormal findings of blood chemistry: Secondary | ICD-10-CM | POA: Diagnosis not present

## 2023-10-24 DIAGNOSIS — R109 Unspecified abdominal pain: Secondary | ICD-10-CM | POA: Diagnosis not present

## 2023-10-24 DIAGNOSIS — F411 Generalized anxiety disorder: Secondary | ICD-10-CM | POA: Diagnosis not present

## 2023-10-24 MED ORDER — SERTRALINE HCL 25 MG PO TABS
25.0000 mg | ORAL_TABLET | Freq: Every day | ORAL | 1 refills | Status: AC
  Filled 2023-10-24: qty 90, 90d supply, fill #0

## 2023-10-24 MED ORDER — DICYCLOMINE HCL 20 MG PO TABS
20.0000 mg | ORAL_TABLET | Freq: Three times a day (TID) | ORAL | 1 refills | Status: AC | PRN
  Filled 2023-10-24: qty 30, 10d supply, fill #0

## 2023-10-24 MED ORDER — ONDANSETRON HCL 4 MG PO TABS
4.0000 mg | ORAL_TABLET | Freq: Three times a day (TID) | ORAL | 1 refills | Status: AC | PRN
  Filled 2023-10-24: qty 20, 7d supply, fill #0

## 2023-10-26 ENCOUNTER — Other Ambulatory Visit: Payer: Self-pay | Admitting: Internal Medicine

## 2023-10-26 DIAGNOSIS — R109 Unspecified abdominal pain: Secondary | ICD-10-CM

## 2023-10-26 DIAGNOSIS — K8020 Calculus of gallbladder without cholecystitis without obstruction: Secondary | ICD-10-CM

## 2023-11-06 ENCOUNTER — Ambulatory Visit
Admission: RE | Admit: 2023-11-06 | Discharge: 2023-11-06 | Disposition: A | Discharge status: Home / Self Care | Source: Ambulatory Visit | Attending: Internal Medicine | Admitting: Internal Medicine

## 2023-11-06 DIAGNOSIS — K8020 Calculus of gallbladder without cholecystitis without obstruction: Secondary | ICD-10-CM | POA: Diagnosis not present

## 2023-11-06 DIAGNOSIS — R109 Unspecified abdominal pain: Secondary | ICD-10-CM | POA: Insufficient documentation

## 2023-11-06 MED ORDER — IOHEXOL 300 MG/ML  SOLN
100.0000 mL | Freq: Once | INTRAMUSCULAR | Status: AC | PRN
  Administered 2023-11-06: 100 mL via INTRAVENOUS

## 2023-11-16 DIAGNOSIS — H5213 Myopia, bilateral: Secondary | ICD-10-CM | POA: Diagnosis not present

## 2023-11-22 DIAGNOSIS — R768 Other specified abnormal immunological findings in serum: Secondary | ICD-10-CM | POA: Diagnosis not present

## 2024-02-29 ENCOUNTER — Other Ambulatory Visit: Payer: Self-pay | Admitting: Family Medicine

## 2024-02-29 DIAGNOSIS — R079 Chest pain, unspecified: Secondary | ICD-10-CM

## 2024-02-29 DIAGNOSIS — R5383 Other fatigue: Secondary | ICD-10-CM

## 2024-02-29 DIAGNOSIS — R0602 Shortness of breath: Secondary | ICD-10-CM

## 2024-03-07 ENCOUNTER — Ambulatory Visit
Admission: RE | Admit: 2024-03-07 | Discharge: 2024-03-07 | Disposition: A | Discharge status: Home / Self Care | Payer: Self-pay | Source: Ambulatory Visit | Attending: Family Medicine | Admitting: Family Medicine

## 2024-03-07 DIAGNOSIS — R5383 Other fatigue: Secondary | ICD-10-CM | POA: Insufficient documentation

## 2024-03-07 DIAGNOSIS — R0602 Shortness of breath: Secondary | ICD-10-CM | POA: Insufficient documentation

## 2024-03-07 DIAGNOSIS — R079 Chest pain, unspecified: Secondary | ICD-10-CM | POA: Insufficient documentation
